# Patient Record
Sex: Female | Born: 1935
Health system: Southern US, Community
[De-identification: ages and names within clinical notes are randomized; demographics above are authoritative.]

---

## 2015-04-15 ENCOUNTER — Inpatient Hospital Stay
Admission: AD | Admit: 2015-04-15 | Discharge: 2015-05-12 | Disposition: A | Discharge status: Skilled Nursing Facility | Payer: Self-pay | Source: Ambulatory Visit | Attending: Internal Medicine | Admitting: Internal Medicine

## 2015-04-15 ENCOUNTER — Institutional Professional Consult (permissible substitution) (HOSPITAL_COMMUNITY): Payer: Self-pay

## 2015-04-15 DIAGNOSIS — J90 Pleural effusion, not elsewhere classified: Secondary | ICD-10-CM

## 2015-04-15 DIAGNOSIS — E8770 Fluid overload, unspecified: Secondary | ICD-10-CM

## 2015-04-15 DIAGNOSIS — L0291 Cutaneous abscess, unspecified: Secondary | ICD-10-CM

## 2015-04-15 DIAGNOSIS — Z9889 Other specified postprocedural states: Secondary | ICD-10-CM

## 2015-04-15 DIAGNOSIS — Z431 Encounter for attention to gastrostomy: Secondary | ICD-10-CM

## 2015-04-15 DIAGNOSIS — A419 Sepsis, unspecified organism: Secondary | ICD-10-CM

## 2015-04-15 LAB — CBC WITH DIFFERENTIAL/PLATELET
BASOS ABS: 0 10*3/uL (ref 0.0–0.1)
Basophils Relative: 1 %
Eosinophils Absolute: 0.3 10*3/uL (ref 0.0–0.7)
Eosinophils Relative: 4 %
HEMATOCRIT: 32.8 % — AB (ref 36.0–46.0)
Hemoglobin: 10.2 g/dL — ABNORMAL LOW (ref 12.0–15.0)
LYMPHS PCT: 13 %
Lymphs Abs: 0.9 10*3/uL (ref 0.7–4.0)
MCH: 28.7 pg (ref 26.0–34.0)
MCHC: 31.1 g/dL (ref 30.0–36.0)
MCV: 92.1 fL (ref 78.0–100.0)
Monocytes Absolute: 0.6 10*3/uL (ref 0.1–1.0)
Monocytes Relative: 8 %
NEUTROS ABS: 5.2 10*3/uL (ref 1.7–7.7)
Neutrophils Relative %: 74 %
Platelets: 381 10*3/uL (ref 150–400)
RBC: 3.56 MIL/uL — AB (ref 3.87–5.11)
RDW: 16.2 % — ABNORMAL HIGH (ref 11.5–15.5)
WBC: 7 10*3/uL (ref 4.0–10.5)

## 2015-04-15 MED ORDER — IOHEXOL 300 MG/ML  SOLN
50.0000 mL | Freq: Once | INTRAMUSCULAR | Status: AC | PRN
  Administered 2015-04-15: 50 mL via ORAL

## 2015-04-16 ENCOUNTER — Other Ambulatory Visit (HOSPITAL_COMMUNITY): Payer: Self-pay

## 2015-04-16 LAB — COMPREHENSIVE METABOLIC PANEL
ALK PHOS: 288 U/L — AB (ref 38–126)
ALT: 100 U/L — AB (ref 14–54)
ANION GAP: 7 (ref 5–15)
AST: 68 U/L — ABNORMAL HIGH (ref 15–41)
Albumin: 1.8 g/dL — ABNORMAL LOW (ref 3.5–5.0)
BILIRUBIN TOTAL: 0.6 mg/dL (ref 0.3–1.2)
BUN: 47 mg/dL — ABNORMAL HIGH (ref 6–20)
CALCIUM: 9.4 mg/dL (ref 8.9–10.3)
CO2: 25 mmol/L (ref 22–32)
CREATININE: 0.81 mg/dL (ref 0.44–1.00)
Chloride: 107 mmol/L (ref 101–111)
Glucose, Bld: 101 mg/dL — ABNORMAL HIGH (ref 65–99)
Potassium: 3.7 mmol/L (ref 3.5–5.1)
Sodium: 139 mmol/L (ref 135–145)
TOTAL PROTEIN: 6.5 g/dL (ref 6.5–8.1)

## 2015-04-16 LAB — T4, FREE: Free T4: 1.26 ng/dL — ABNORMAL HIGH (ref 0.61–1.12)

## 2015-04-16 LAB — PHOSPHORUS: PHOSPHORUS: 4.8 mg/dL — AB (ref 2.5–4.6)

## 2015-04-16 LAB — TSH: TSH: 8.952 u[IU]/mL — ABNORMAL HIGH (ref 0.350–4.500)

## 2015-04-16 LAB — MAGNESIUM: Magnesium: 1.8 mg/dL (ref 1.7–2.4)

## 2015-04-16 LAB — VITAMIN B12: VITAMIN B 12: 409 pg/mL (ref 180–914)

## 2015-04-17 LAB — BASIC METABOLIC PANEL
Anion gap: 4 — ABNORMAL LOW (ref 5–15)
BUN: 45 mg/dL — AB (ref 6–20)
CALCIUM: 8.9 mg/dL (ref 8.9–10.3)
CHLORIDE: 111 mmol/L (ref 101–111)
CO2: 24 mmol/L (ref 22–32)
CREATININE: 0.71 mg/dL (ref 0.44–1.00)
GFR calc Af Amer: >60 mL/min (ref >60)
GFR calc non Af Amer: >60 mL/min (ref >60)
Glucose, Bld: 123 mg/dL — ABNORMAL HIGH (ref 65–99)
Potassium: 3.9 mmol/L (ref 3.5–5.1)
SODIUM: 139 mmol/L (ref 135–145)

## 2015-04-18 LAB — BASIC METABOLIC PANEL
Anion gap: 3 — ABNORMAL LOW (ref 5–15)
BUN: 40 mg/dL — ABNORMAL HIGH (ref 6–20)
CALCIUM: 8.7 mg/dL — AB (ref 8.9–10.3)
CO2: 23 mmol/L (ref 22–32)
CREATININE: 0.71 mg/dL (ref 0.44–1.00)
Chloride: 112 mmol/L — ABNORMAL HIGH (ref 101–111)
GFR calc non Af Amer: >60 mL/min (ref >60)
GLUCOSE: 137 mg/dL — AB (ref 65–99)
Potassium: 3.7 mmol/L (ref 3.5–5.1)
Sodium: 138 mmol/L (ref 135–145)

## 2015-04-19 LAB — BASIC METABOLIC PANEL
ANION GAP: 5 (ref 5–15)
BUN: 35 mg/dL — ABNORMAL HIGH (ref 6–20)
CALCIUM: 8.5 mg/dL — AB (ref 8.9–10.3)
CO2: 21 mmol/L — ABNORMAL LOW (ref 22–32)
Chloride: 112 mmol/L — ABNORMAL HIGH (ref 101–111)
Creatinine, Ser: 0.63 mg/dL (ref 0.44–1.00)
GLUCOSE: 124 mg/dL — AB (ref 65–99)
POTASSIUM: 3.1 mmol/L — AB (ref 3.5–5.1)
SODIUM: 138 mmol/L (ref 135–145)

## 2015-04-20 LAB — TRIGLYCERIDES: TRIGLYCERIDES: 121 mg/dL (ref <150)

## 2015-04-20 LAB — CBC WITH DIFFERENTIAL/PLATELET
BASOS PCT: 1 %
Basophils Absolute: 0.1 10*3/uL (ref 0.0–0.1)
EOS ABS: 0.4 10*3/uL (ref 0.0–0.7)
EOS PCT: 8 %
HCT: 28.7 % — ABNORMAL LOW (ref 36.0–46.0)
HEMOGLOBIN: 9.3 g/dL — AB (ref 12.0–15.0)
Lymphocytes Relative: 20 %
Lymphs Abs: 1 10*3/uL (ref 0.7–4.0)
MCH: 30 pg (ref 26.0–34.0)
MCHC: 32.4 g/dL (ref 30.0–36.0)
MCV: 92.6 fL (ref 78.0–100.0)
Monocytes Absolute: 0.5 10*3/uL (ref 0.1–1.0)
Monocytes Relative: 9 %
NEUTROS PCT: 62 %
Neutro Abs: 3.1 10*3/uL (ref 1.7–7.7)
Platelets: 262 10*3/uL (ref 150–400)
RBC: 3.1 MIL/uL — ABNORMAL LOW (ref 3.87–5.11)
RDW: 16.9 % — AB (ref 11.5–15.5)
WBC: 5.1 10*3/uL (ref 4.0–10.5)

## 2015-04-20 LAB — COMPREHENSIVE METABOLIC PANEL
ALBUMIN: 1.7 g/dL — AB (ref 3.5–5.0)
ALT: 35 U/L (ref 14–54)
ANION GAP: 4 — AB (ref 5–15)
AST: 30 U/L (ref 15–41)
Alkaline Phosphatase: 169 U/L — ABNORMAL HIGH (ref 38–126)
BILIRUBIN TOTAL: 0.3 mg/dL (ref 0.3–1.2)
BUN: 29 mg/dL — ABNORMAL HIGH (ref 6–20)
CALCIUM: 8.6 mg/dL — AB (ref 8.9–10.3)
CO2: 21 mmol/L — ABNORMAL LOW (ref 22–32)
Chloride: 113 mmol/L — ABNORMAL HIGH (ref 101–111)
Creatinine, Ser: 0.59 mg/dL (ref 0.44–1.00)
Glucose, Bld: 112 mg/dL — ABNORMAL HIGH (ref 65–99)
POTASSIUM: 3.2 mmol/L — AB (ref 3.5–5.1)
Sodium: 138 mmol/L (ref 135–145)
TOTAL PROTEIN: 5.9 g/dL — AB (ref 6.5–8.1)

## 2015-04-20 LAB — FOLATE RBC
FOLATE, HEMOLYSATE: 535.7 ng/mL
FOLATE, RBC: 1413 ng/mL (ref >498)
HEMATOCRIT: 37.9 % (ref 34.0–46.6)

## 2015-04-20 LAB — PHOSPHORUS: PHOSPHORUS: 1.7 mg/dL — AB (ref 2.5–4.6)

## 2015-04-20 LAB — MAGNESIUM: MAGNESIUM: 1.2 mg/dL — AB (ref 1.7–2.4)

## 2015-04-21 LAB — BASIC METABOLIC PANEL
Anion gap: 6 (ref 5–15)
BUN: 27 mg/dL — AB (ref 6–20)
CALCIUM: 8.4 mg/dL — AB (ref 8.9–10.3)
CHLORIDE: 116 mmol/L — AB (ref 101–111)
CO2: 18 mmol/L — ABNORMAL LOW (ref 22–32)
CREATININE: 0.56 mg/dL (ref 0.44–1.00)
Glucose, Bld: 167 mg/dL — ABNORMAL HIGH (ref 65–99)
Potassium: 4.3 mmol/L (ref 3.5–5.1)
SODIUM: 140 mmol/L (ref 135–145)

## 2015-04-22 ENCOUNTER — Other Ambulatory Visit (HOSPITAL_COMMUNITY): Payer: Self-pay

## 2015-04-22 LAB — BASIC METABOLIC PANEL
ANION GAP: 8 (ref 5–15)
BUN: 39 mg/dL — ABNORMAL HIGH (ref 6–20)
CHLORIDE: 116 mmol/L — AB (ref 101–111)
CO2: 17 mmol/L — ABNORMAL LOW (ref 22–32)
Calcium: 8.6 mg/dL — ABNORMAL LOW (ref 8.9–10.3)
Creatinine, Ser: 0.58 mg/dL (ref 0.44–1.00)
GFR calc Af Amer: >60 mL/min (ref >60)
GLUCOSE: 118 mg/dL — AB (ref 65–99)
POTASSIUM: 3.8 mmol/L (ref 3.5–5.1)
Sodium: 141 mmol/L (ref 135–145)

## 2015-04-22 LAB — MAGNESIUM: MAGNESIUM: 1.7 mg/dL (ref 1.7–2.4)

## 2015-04-23 LAB — BASIC METABOLIC PANEL
Anion gap: 6 (ref 5–15)
BUN: 36 mg/dL — ABNORMAL HIGH (ref 6–20)
CALCIUM: 8.7 mg/dL — AB (ref 8.9–10.3)
CO2: 22 mmol/L (ref 22–32)
CREATININE: 0.58 mg/dL (ref 0.44–1.00)
Chloride: 113 mmol/L — ABNORMAL HIGH (ref 101–111)
GFR calc Af Amer: >60 mL/min (ref >60)
GFR calc non Af Amer: >60 mL/min (ref >60)
GLUCOSE: 125 mg/dL — AB (ref 65–99)
Potassium: 3.9 mmol/L (ref 3.5–5.1)
Sodium: 141 mmol/L (ref 135–145)

## 2015-04-24 ENCOUNTER — Other Ambulatory Visit (HOSPITAL_COMMUNITY): Payer: Self-pay

## 2015-04-24 LAB — BASIC METABOLIC PANEL
ANION GAP: 9 (ref 5–15)
BUN: 38 mg/dL — ABNORMAL HIGH (ref 6–20)
CHLORIDE: 110 mmol/L (ref 101–111)
CO2: 23 mmol/L (ref 22–32)
Calcium: 8.8 mg/dL — ABNORMAL LOW (ref 8.9–10.3)
Creatinine, Ser: 0.55 mg/dL (ref 0.44–1.00)
Glucose, Bld: 118 mg/dL — ABNORMAL HIGH (ref 65–99)
POTASSIUM: 4 mmol/L (ref 3.5–5.1)
SODIUM: 142 mmol/L (ref 135–145)

## 2015-04-24 LAB — CBC WITH DIFFERENTIAL/PLATELET
BASOS ABS: 0 10*3/uL (ref 0.0–0.1)
Basophils Relative: 1 %
EOS PCT: 8 %
Eosinophils Absolute: 0.4 10*3/uL (ref 0.0–0.7)
HCT: 31 % — ABNORMAL LOW (ref 36.0–46.0)
HEMOGLOBIN: 9.5 g/dL — AB (ref 12.0–15.0)
LYMPHS ABS: 1.1 10*3/uL (ref 0.7–4.0)
LYMPHS PCT: 20 %
MCH: 28.9 pg (ref 26.0–34.0)
MCHC: 30.6 g/dL (ref 30.0–36.0)
MCV: 94.2 fL (ref 78.0–100.0)
Monocytes Absolute: 0.5 10*3/uL (ref 0.1–1.0)
Monocytes Relative: 9 %
NEUTROS ABS: 3.5 10*3/uL (ref 1.7–7.7)
NEUTROS PCT: 62 %
PLATELETS: 282 10*3/uL (ref 150–400)
RBC: 3.29 MIL/uL — AB (ref 3.87–5.11)
RDW: 17.5 % — ABNORMAL HIGH (ref 11.5–15.5)
WBC: 5.6 10*3/uL (ref 4.0–10.5)

## 2015-04-24 LAB — BODY FLUID CELL COUNT WITH DIFFERENTIAL
EOS FL: 19 %
Lymphs, Fluid: 62 %
Monocyte-Macrophage-Serous Fluid: 11 % — ABNORMAL LOW (ref 50–90)
NEUTROPHIL FLUID: 8 % (ref 0–25)
WBC FLUID: 405 uL (ref 0–1000)

## 2015-04-24 LAB — LACTATE DEHYDROGENASE, PLEURAL OR PERITONEAL FLUID: LD, Fluid: 254 U/L — ABNORMAL HIGH (ref 3–23)

## 2015-04-24 LAB — GRAM STAIN

## 2015-04-24 LAB — PROCALCITONIN

## 2015-04-24 MED ORDER — LIDOCAINE HCL (PF) 1 % IJ SOLN
INTRAMUSCULAR | Status: AC
  Filled 2015-04-24: qty 10

## 2015-04-24 NOTE — Procedures (Signed)
US guided diagnostic/therapeutic left thoracentesis performed yielding 600 cc turbid, amber fluid. The fluid was sent to the lab for preordered studies. F/u CXR pending. No immediate complications. Only the above amount of fluid was removed today secondary to pt coughing.

## 2015-04-25 LAB — BASIC METABOLIC PANEL
Anion gap: 6 (ref 5–15)
BUN: 45 mg/dL — AB (ref 6–20)
CHLORIDE: 111 mmol/L (ref 101–111)
CO2: 23 mmol/L (ref 22–32)
Calcium: 9.1 mg/dL (ref 8.9–10.3)
Creatinine, Ser: 0.81 mg/dL (ref 0.44–1.00)
GFR calc non Af Amer: >60 mL/min (ref >60)
Glucose, Bld: 135 mg/dL — ABNORMAL HIGH (ref 65–99)
POTASSIUM: 4.8 mmol/L (ref 3.5–5.1)
SODIUM: 140 mmol/L (ref 135–145)

## 2015-04-26 LAB — BASIC METABOLIC PANEL
ANION GAP: 7 (ref 5–15)
BUN: 38 mg/dL — ABNORMAL HIGH (ref 6–20)
CALCIUM: 8.9 mg/dL (ref 8.9–10.3)
CO2: 22 mmol/L (ref 22–32)
Chloride: 110 mmol/L (ref 101–111)
Creatinine, Ser: 0.53 mg/dL (ref 0.44–1.00)
Glucose, Bld: 124 mg/dL — ABNORMAL HIGH (ref 65–99)
Potassium: 4.5 mmol/L (ref 3.5–5.1)
SODIUM: 139 mmol/L (ref 135–145)

## 2015-04-27 LAB — COMPREHENSIVE METABOLIC PANEL
ALK PHOS: 160 U/L — AB (ref 38–126)
ALT: 29 U/L (ref 14–54)
AST: 28 U/L (ref 15–41)
Albumin: 1.9 g/dL — ABNORMAL LOW (ref 3.5–5.0)
Anion gap: 6 (ref 5–15)
BILIRUBIN TOTAL: 0.7 mg/dL (ref 0.3–1.2)
BUN: 42 mg/dL — ABNORMAL HIGH (ref 6–20)
CALCIUM: 8.9 mg/dL (ref 8.9–10.3)
CO2: 22 mmol/L (ref 22–32)
CREATININE: 0.56 mg/dL (ref 0.44–1.00)
Chloride: 110 mmol/L (ref 101–111)
Glucose, Bld: 115 mg/dL — ABNORMAL HIGH (ref 65–99)
Potassium: 4.6 mmol/L (ref 3.5–5.1)
Sodium: 138 mmol/L (ref 135–145)
TOTAL PROTEIN: 6.2 g/dL — AB (ref 6.5–8.1)

## 2015-04-27 LAB — TRIGLYCERIDES: Triglycerides: 128 mg/dL (ref <150)

## 2015-04-27 LAB — PHOSPHORUS: PHOSPHORUS: 4.5 mg/dL (ref 2.5–4.6)

## 2015-04-27 LAB — MAGNESIUM: MAGNESIUM: 1.7 mg/dL (ref 1.7–2.4)

## 2015-04-28 ENCOUNTER — Other Ambulatory Visit (HOSPITAL_COMMUNITY): Payer: Self-pay

## 2015-04-28 LAB — COMPREHENSIVE METABOLIC PANEL
ALBUMIN: 2 g/dL — AB (ref 3.5–5.0)
ALK PHOS: 163 U/L — AB (ref 38–126)
ALT: 28 U/L (ref 14–54)
AST: 25 U/L (ref 15–41)
Anion gap: 6 (ref 5–15)
BILIRUBIN TOTAL: 0.6 mg/dL (ref 0.3–1.2)
BUN: 40 mg/dL — AB (ref 6–20)
CALCIUM: 9.1 mg/dL (ref 8.9–10.3)
CO2: 22 mmol/L (ref 22–32)
CREATININE: 0.62 mg/dL (ref 0.44–1.00)
Chloride: 107 mmol/L (ref 101–111)
GFR calc Af Amer: >60 mL/min (ref >60)
GFR calc non Af Amer: >60 mL/min (ref >60)
GLUCOSE: 126 mg/dL — AB (ref 65–99)
Potassium: 4.3 mmol/L (ref 3.5–5.1)
Sodium: 135 mmol/L (ref 135–145)
TOTAL PROTEIN: 6.6 g/dL (ref 6.5–8.1)

## 2015-04-28 LAB — CBC WITH DIFFERENTIAL/PLATELET
BASOS ABS: 0 10*3/uL (ref 0.0–0.1)
BASOS PCT: 1 %
EOS ABS: 0.5 10*3/uL (ref 0.0–0.7)
EOS PCT: 9 %
HEMATOCRIT: 31.9 % — AB (ref 36.0–46.0)
HEMOGLOBIN: 9.9 g/dL — AB (ref 12.0–15.0)
Lymphocytes Relative: 21 %
Lymphs Abs: 1 10*3/uL (ref 0.7–4.0)
MCH: 29.1 pg (ref 26.0–34.0)
MCHC: 31 g/dL (ref 30.0–36.0)
MCV: 93.8 fL (ref 78.0–100.0)
MONO ABS: 0.5 10*3/uL (ref 0.1–1.0)
MONOS PCT: 10 %
Neutro Abs: 3 10*3/uL (ref 1.7–7.7)
Neutrophils Relative %: 59 %
Platelets: 295 10*3/uL (ref 150–400)
RBC: 3.4 MIL/uL — ABNORMAL LOW (ref 3.87–5.11)
RDW: 17.5 % — ABNORMAL HIGH (ref 11.5–15.5)
WBC: 5.1 10*3/uL (ref 4.0–10.5)

## 2015-04-28 LAB — PHOSPHORUS: Phosphorus: 5 mg/dL — ABNORMAL HIGH (ref 2.5–4.6)

## 2015-04-28 LAB — MAGNESIUM: Magnesium: 1.7 mg/dL (ref 1.7–2.4)

## 2015-04-29 LAB — BASIC METABOLIC PANEL
Anion gap: 8 (ref 5–15)
BUN: 41 mg/dL — AB (ref 6–20)
CALCIUM: 9.2 mg/dL (ref 8.9–10.3)
CO2: 22 mmol/L (ref 22–32)
CREATININE: 0.66 mg/dL (ref 0.44–1.00)
Chloride: 105 mmol/L (ref 101–111)
GFR calc Af Amer: >60 mL/min (ref >60)
GLUCOSE: 124 mg/dL — AB (ref 65–99)
Potassium: 4.5 mmol/L (ref 3.5–5.1)
Sodium: 135 mmol/L (ref 135–145)

## 2015-04-29 LAB — CULTURE, BODY FLUID W GRAM STAIN -BOTTLE

## 2015-04-29 LAB — CULTURE, BODY FLUID-BOTTLE: CULTURE: NO GROWTH

## 2015-04-30 ENCOUNTER — Other Ambulatory Visit (HOSPITAL_COMMUNITY): Payer: Self-pay

## 2015-04-30 ENCOUNTER — Encounter: Payer: Self-pay | Admitting: Radiology

## 2015-04-30 LAB — BASIC METABOLIC PANEL
Anion gap: 6 (ref 5–15)
BUN: 48 mg/dL — AB (ref 6–20)
CALCIUM: 9.2 mg/dL (ref 8.9–10.3)
CHLORIDE: 107 mmol/L (ref 101–111)
CO2: 22 mmol/L (ref 22–32)
CREATININE: 0.71 mg/dL (ref 0.44–1.00)
GFR calc Af Amer: >60 mL/min (ref >60)
GFR calc non Af Amer: >60 mL/min (ref >60)
GLUCOSE: 138 mg/dL — AB (ref 65–99)
Potassium: 4.6 mmol/L (ref 3.5–5.1)
Sodium: 135 mmol/L (ref 135–145)

## 2015-04-30 LAB — CBC WITH DIFFERENTIAL/PLATELET
BASOS PCT: 1 %
Basophils Absolute: 0 10*3/uL (ref 0.0–0.1)
EOS ABS: 0.6 10*3/uL (ref 0.0–0.7)
Eosinophils Relative: 9 %
HCT: 30.9 % — ABNORMAL LOW (ref 36.0–46.0)
HEMOGLOBIN: 9.5 g/dL — AB (ref 12.0–15.0)
LYMPHS ABS: 0.9 10*3/uL (ref 0.7–4.0)
Lymphocytes Relative: 14 %
MCH: 28.7 pg (ref 26.0–34.0)
MCHC: 30.7 g/dL (ref 30.0–36.0)
MCV: 93.4 fL (ref 78.0–100.0)
MONO ABS: 0.6 10*3/uL (ref 0.1–1.0)
MONOS PCT: 10 %
Neutro Abs: 4.3 10*3/uL (ref 1.7–7.7)
Neutrophils Relative %: 66 %
Platelets: 287 10*3/uL (ref 150–400)
RBC: 3.31 MIL/uL — ABNORMAL LOW (ref 3.87–5.11)
RDW: 17.6 % — AB (ref 11.5–15.5)
WBC: 6.5 10*3/uL (ref 4.0–10.5)

## 2015-04-30 LAB — MAGNESIUM: MAGNESIUM: 1.9 mg/dL (ref 1.7–2.4)

## 2015-04-30 LAB — PHOSPHORUS: PHOSPHORUS: 5.1 mg/dL — AB (ref 2.5–4.6)

## 2015-04-30 MED ORDER — IOHEXOL 300 MG/ML  SOLN
80.0000 mL | Freq: Once | INTRAMUSCULAR | Status: AC | PRN
  Administered 2015-04-30: 80 mL via INTRAVENOUS

## 2015-05-01 ENCOUNTER — Other Ambulatory Visit (HOSPITAL_COMMUNITY): Payer: Self-pay

## 2015-05-01 LAB — CBC WITH DIFFERENTIAL/PLATELET
Basophils Absolute: 0 10*3/uL (ref 0.0–0.1)
Basophils Relative: 1 %
EOS ABS: 0.4 10*3/uL (ref 0.0–0.7)
EOS PCT: 8 %
HCT: 31 % — ABNORMAL LOW (ref 36.0–46.0)
HEMOGLOBIN: 9.9 g/dL — AB (ref 12.0–15.0)
LYMPHS ABS: 0.9 10*3/uL (ref 0.7–4.0)
Lymphocytes Relative: 16 %
MCH: 29.9 pg (ref 26.0–34.0)
MCHC: 31.9 g/dL (ref 30.0–36.0)
MCV: 93.7 fL (ref 78.0–100.0)
Monocytes Absolute: 0.6 10*3/uL (ref 0.1–1.0)
Monocytes Relative: 12 %
Neutro Abs: 3.5 10*3/uL (ref 1.7–7.7)
Neutrophils Relative %: 63 %
Platelets: 294 10*3/uL (ref 150–400)
RBC: 3.31 MIL/uL — AB (ref 3.87–5.11)
RDW: 17.5 % — ABNORMAL HIGH (ref 11.5–15.5)
WBC: 5.5 10*3/uL (ref 4.0–10.5)

## 2015-05-01 LAB — BASIC METABOLIC PANEL
Anion gap: 5 (ref 5–15)
BUN: 49 mg/dL — AB (ref 6–20)
CALCIUM: 9.6 mg/dL (ref 8.9–10.3)
CO2: 25 mmol/L (ref 22–32)
CREATININE: 0.69 mg/dL (ref 0.44–1.00)
Chloride: 108 mmol/L (ref 101–111)
GFR calc Af Amer: >60 mL/min (ref >60)
Glucose, Bld: 119 mg/dL — ABNORMAL HIGH (ref 65–99)
Potassium: 5.2 mmol/L — ABNORMAL HIGH (ref 3.5–5.1)
SODIUM: 138 mmol/L (ref 135–145)

## 2015-05-01 LAB — MAGNESIUM: MAGNESIUM: 1.9 mg/dL (ref 1.7–2.4)

## 2015-05-01 LAB — PHOSPHORUS: Phosphorus: 4.6 mg/dL (ref 2.5–4.6)

## 2015-05-01 NOTE — Progress Notes (Signed)
Patient ID: [name], female   DOB: 05/30/1933, 81 y.o.   MRN: [id number]   afeb; wbc wnl;  No abd pain CT abd/pelvis 12/8 -- ordered for follow up of abscesses and drains from outside facility--- was reviewed with Dr Miles CostainShick RLQ collection too small to aspirate or drain Rec: no intervention for now Call if need us for any changes  [facility]

## 2015-05-02 LAB — BASIC METABOLIC PANEL
ANION GAP: 3 — AB (ref 5–15)
BUN: 41 mg/dL — ABNORMAL HIGH (ref 6–20)
CALCIUM: 9.3 mg/dL (ref 8.9–10.3)
CO2: 24 mmol/L (ref 22–32)
Chloride: 113 mmol/L — ABNORMAL HIGH (ref 101–111)
Creatinine, Ser: 0.6 mg/dL (ref 0.44–1.00)
GLUCOSE: 127 mg/dL — AB (ref 65–99)
POTASSIUM: 4.8 mmol/L (ref 3.5–5.1)
Sodium: 140 mmol/L (ref 135–145)

## 2015-05-03 LAB — BASIC METABOLIC PANEL
ANION GAP: 5 (ref 5–15)
BUN: 41 mg/dL — ABNORMAL HIGH (ref 6–20)
CALCIUM: 9.1 mg/dL (ref 8.9–10.3)
CO2: 23 mmol/L (ref 22–32)
Chloride: 110 mmol/L (ref 101–111)
Creatinine, Ser: 0.7 mg/dL (ref 0.44–1.00)
GLUCOSE: 120 mg/dL — AB (ref 65–99)
Potassium: 4.6 mmol/L (ref 3.5–5.1)
SODIUM: 138 mmol/L (ref 135–145)

## 2015-05-04 ENCOUNTER — Other Ambulatory Visit (HOSPITAL_COMMUNITY): Payer: Self-pay

## 2015-05-04 LAB — COMPREHENSIVE METABOLIC PANEL
ALT: 22 U/L (ref 14–54)
AST: 30 U/L (ref 15–41)
Albumin: 2 g/dL — ABNORMAL LOW (ref 3.5–5.0)
Alkaline Phosphatase: 151 U/L — ABNORMAL HIGH (ref 38–126)
Anion gap: 6 (ref 5–15)
BUN: 40 mg/dL — ABNORMAL HIGH (ref 6–20)
CALCIUM: 9.1 mg/dL (ref 8.9–10.3)
CHLORIDE: 110 mmol/L (ref 101–111)
CO2: 21 mmol/L — ABNORMAL LOW (ref 22–32)
CREATININE: 0.55 mg/dL (ref 0.44–1.00)
Glucose, Bld: 123 mg/dL — ABNORMAL HIGH (ref 65–99)
Potassium: 4.5 mmol/L (ref 3.5–5.1)
Sodium: 137 mmol/L (ref 135–145)
Total Bilirubin: 0.4 mg/dL (ref 0.3–1.2)
Total Protein: 6.5 g/dL (ref 6.5–8.1)

## 2015-05-04 LAB — CBC WITH DIFFERENTIAL/PLATELET
BASOS ABS: 0 10*3/uL (ref 0.0–0.1)
BASOS PCT: 0 %
EOS ABS: 0.2 10*3/uL (ref 0.0–0.7)
EOS PCT: 4 %
HCT: 33.9 % — ABNORMAL LOW (ref 36.0–46.0)
Hemoglobin: 10.3 g/dL — ABNORMAL LOW (ref 12.0–15.0)
Lymphocytes Relative: 20 %
Lymphs Abs: 1 10*3/uL (ref 0.7–4.0)
MCH: 28.9 pg (ref 26.0–34.0)
MCHC: 30.4 g/dL (ref 30.0–36.0)
MCV: 95.2 fL (ref 78.0–100.0)
MONO ABS: 0.6 10*3/uL (ref 0.1–1.0)
Monocytes Relative: 11 %
NEUTROS ABS: 3.2 10*3/uL (ref 1.7–7.7)
Neutrophils Relative %: 65 %
PLATELETS: 286 10*3/uL (ref 150–400)
RBC: 3.56 MIL/uL — ABNORMAL LOW (ref 3.87–5.11)
RDW: 17.1 % — AB (ref 11.5–15.5)
WBC: 5 10*3/uL (ref 4.0–10.5)

## 2015-05-04 LAB — TRIGLYCERIDES: TRIGLYCERIDES: 103 mg/dL (ref <150)

## 2015-05-04 LAB — MAGNESIUM: MAGNESIUM: 1.7 mg/dL (ref 1.7–2.4)

## 2015-05-04 LAB — PHOSPHORUS: PHOSPHORUS: 4.4 mg/dL (ref 2.5–4.6)

## 2015-05-05 LAB — BASIC METABOLIC PANEL
Anion gap: 8 (ref 5–15)
BUN: 40 mg/dL — ABNORMAL HIGH (ref 6–20)
CALCIUM: 8.8 mg/dL — AB (ref 8.9–10.3)
CO2: 19 mmol/L — AB (ref 22–32)
CREATININE: 0.55 mg/dL (ref 0.44–1.00)
Chloride: 110 mmol/L (ref 101–111)
Glucose, Bld: 134 mg/dL — ABNORMAL HIGH (ref 65–99)
Potassium: 4.2 mmol/L (ref 3.5–5.1)
SODIUM: 137 mmol/L (ref 135–145)

## 2015-05-06 LAB — BASIC METABOLIC PANEL
Anion gap: 6 (ref 5–15)
BUN: 37 mg/dL — AB (ref 6–20)
CALCIUM: 8.9 mg/dL (ref 8.9–10.3)
CO2: 21 mmol/L — ABNORMAL LOW (ref 22–32)
CREATININE: 0.6 mg/dL (ref 0.44–1.00)
Chloride: 112 mmol/L — ABNORMAL HIGH (ref 101–111)
Glucose, Bld: 119 mg/dL — ABNORMAL HIGH (ref 65–99)
Potassium: 4.2 mmol/L (ref 3.5–5.1)
SODIUM: 139 mmol/L (ref 135–145)

## 2015-05-07 LAB — BASIC METABOLIC PANEL WITH GFR
Anion gap: 6 (ref 5–15)
BUN: 49 mg/dL — ABNORMAL HIGH (ref 6–20)
CO2: 21 mmol/L — ABNORMAL LOW (ref 22–32)
Calcium: 9.1 mg/dL (ref 8.9–10.3)
Chloride: 111 mmol/L (ref 101–111)
Creatinine, Ser: 0.71 mg/dL (ref 0.44–1.00)
GFR calc Af Amer: >60 mL/min
GFR calc non Af Amer: >60 mL/min
Glucose, Bld: 112 mg/dL — ABNORMAL HIGH (ref 65–99)
Potassium: 4.5 mmol/L (ref 3.5–5.1)
Sodium: 138 mmol/L (ref 135–145)

## 2015-05-11 LAB — CBC WITH DIFFERENTIAL/PLATELET
BASOS ABS: 0 10*3/uL (ref 0.0–0.1)
BASOS PCT: 1 %
EOS PCT: 9 %
Eosinophils Absolute: 0.4 10*3/uL (ref 0.0–0.7)
HCT: 33.2 % — ABNORMAL LOW (ref 36.0–46.0)
Hemoglobin: 10.3 g/dL — ABNORMAL LOW (ref 12.0–15.0)
LYMPHS PCT: 17 %
Lymphs Abs: 0.8 10*3/uL (ref 0.7–4.0)
MCH: 29.2 pg (ref 26.0–34.0)
MCHC: 31 g/dL (ref 30.0–36.0)
MCV: 94.1 fL (ref 78.0–100.0)
MONO ABS: 0.5 10*3/uL (ref 0.1–1.0)
Monocytes Relative: 12 %
Neutro Abs: 2.8 10*3/uL (ref 1.7–7.7)
Neutrophils Relative %: 61 %
PLATELETS: 327 10*3/uL (ref 150–400)
RBC: 3.53 MIL/uL — AB (ref 3.87–5.11)
RDW: 16.7 % — AB (ref 11.5–15.5)
WBC: 4.5 10*3/uL (ref 4.0–10.5)

## 2015-05-11 LAB — COMPREHENSIVE METABOLIC PANEL
ALK PHOS: 157 U/L — AB (ref 38–126)
ALT: 28 U/L (ref 14–54)
ANION GAP: 7 (ref 5–15)
AST: 29 U/L (ref 15–41)
Albumin: 2.1 g/dL — ABNORMAL LOW (ref 3.5–5.0)
BUN: 36 mg/dL — ABNORMAL HIGH (ref 6–20)
CALCIUM: 9.5 mg/dL (ref 8.9–10.3)
CO2: 24 mmol/L (ref 22–32)
CREATININE: 0.62 mg/dL (ref 0.44–1.00)
Chloride: 106 mmol/L (ref 101–111)
Glucose, Bld: 123 mg/dL — ABNORMAL HIGH (ref 65–99)
Potassium: 4.7 mmol/L (ref 3.5–5.1)
SODIUM: 137 mmol/L (ref 135–145)
TOTAL PROTEIN: 6.6 g/dL (ref 6.5–8.1)
Total Bilirubin: 0.4 mg/dL (ref 0.3–1.2)

## 2015-05-11 LAB — PHOSPHORUS: PHOSPHORUS: 5 mg/dL — AB (ref 2.5–4.6)

## 2015-05-11 LAB — MAGNESIUM: MAGNESIUM: 1.8 mg/dL (ref 1.7–2.4)

## 2015-05-11 LAB — TRIGLYCERIDES: TRIGLYCERIDES: 138 mg/dL (ref <150)

## 2016-03-28 IMAGING — CR DG CHEST 1V PORT
1 series · 1 of 1 positions shown · non-contrast
Comparison: Chest x-ray of May 01, 2015

CLINICAL DATA: Follow-up left pleural effusion

EXAM:
PORTABLE CHEST 1 VIEW

[AP]
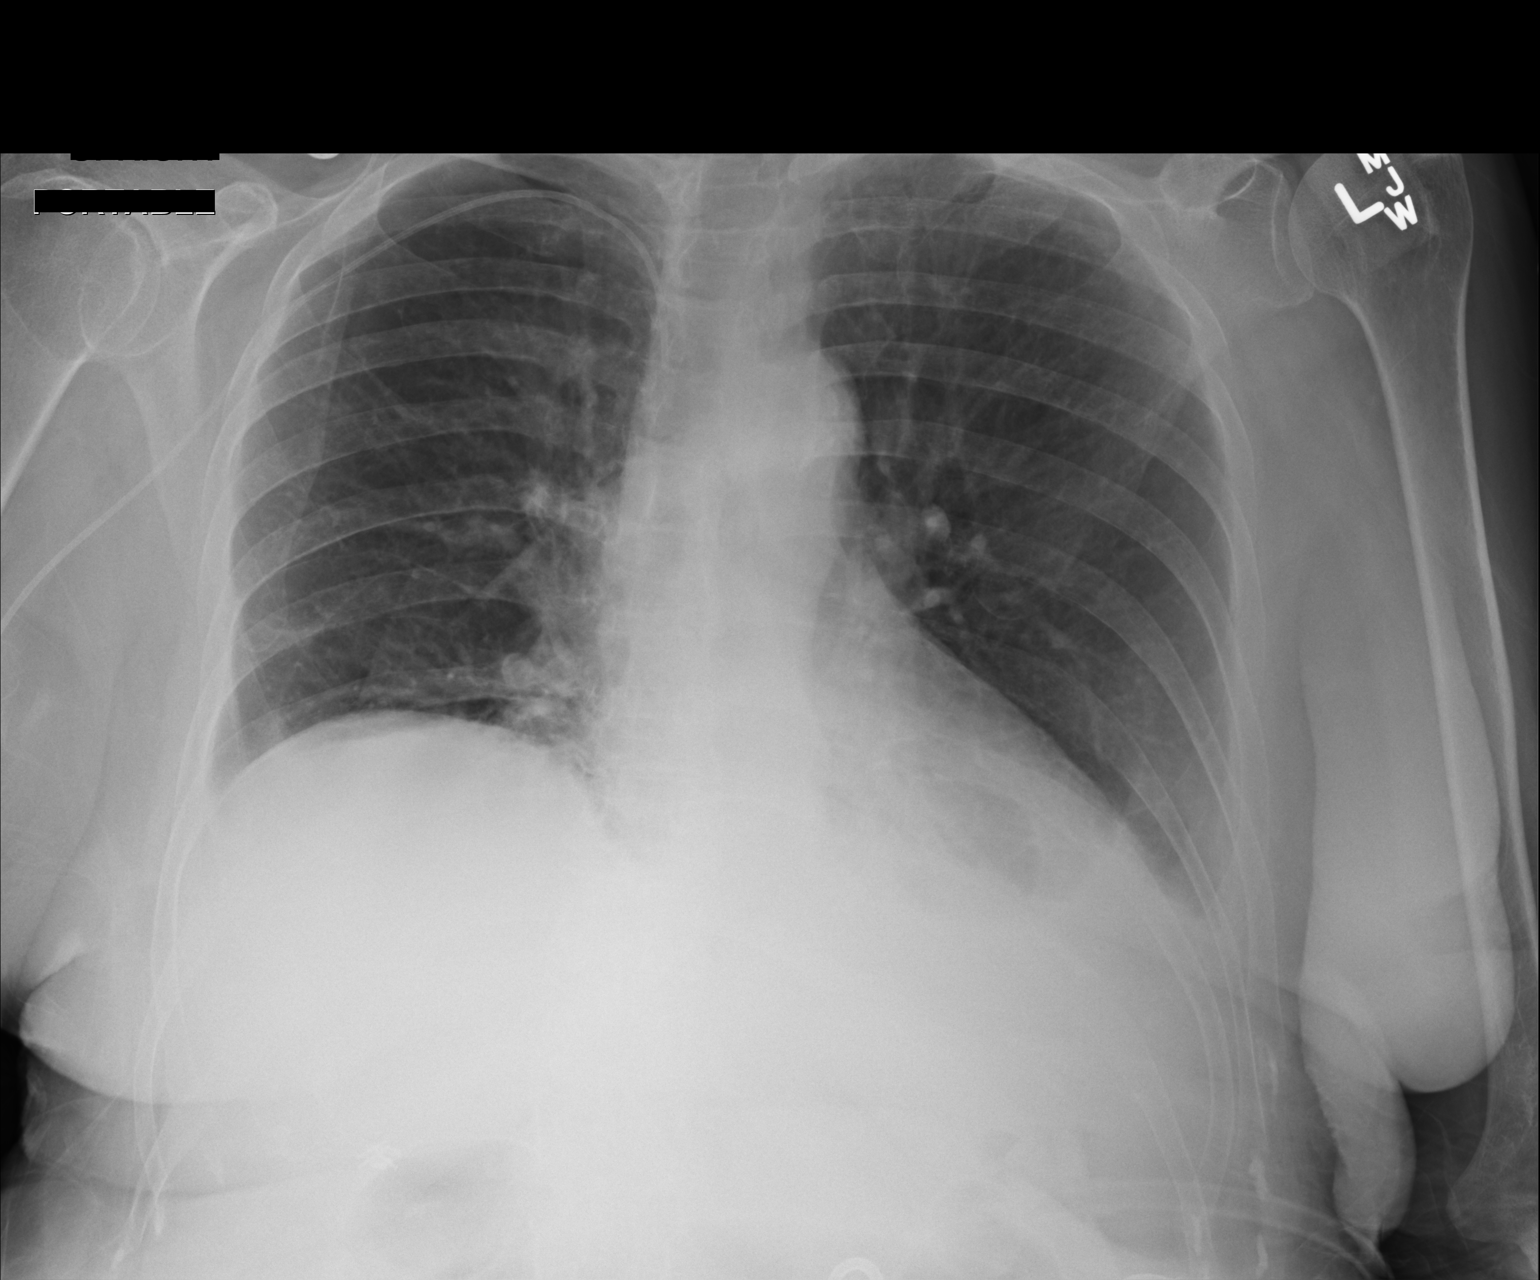

[1 of 1 positions shown; findings below may reference images not displayed]

FINDINGS: There remains small left pleural effusion. There is left basilar
atelectasis or pneumonia. Minimal atelectasis or infiltrate at the
right lung base medially is present but stable. The heart is normal
in size. The pulmonary vascularity is not engorged. The right-sided
PICC line tip projects over the proximal SVC. The bony thorax
exhibits no acute abnormality. A PEG tube and a left-sided
nephrostomy tube are visible at the inferior margin of the study.
IMPRESSION: Stable appearance of the chest with mild bibasilar
atelectasis/pneumonia with small pleural effusions greater on the
left than on the right.

## 2023-05-24 DEATH — deceased
